# Patient Record
Sex: Female | Born: 1935 | Race: White | Hispanic: No | Smoking: Former smoker
Health system: Southern US, Community
[De-identification: ages and names within clinical notes are randomized; demographics above are authoritative.]

## PROBLEM LIST (undated history)

## (undated) DIAGNOSIS — I10 Essential (primary) hypertension: Secondary | ICD-10-CM

## (undated) DIAGNOSIS — E559 Vitamin D deficiency, unspecified: Secondary | ICD-10-CM

## (undated) DIAGNOSIS — N183 Chronic kidney disease, stage 3 (moderate): Secondary | ICD-10-CM

## (undated) DIAGNOSIS — I959 Hypotension, unspecified: Secondary | ICD-10-CM

## (undated) DIAGNOSIS — E1140 Type 2 diabetes mellitus with diabetic neuropathy, unspecified: Secondary | ICD-10-CM

## (undated) DIAGNOSIS — E119 Type 2 diabetes mellitus without complications: Secondary | ICD-10-CM

## (undated) DIAGNOSIS — Z905 Acquired absence of kidney: Secondary | ICD-10-CM

## (undated) DIAGNOSIS — I639 Cerebral infarction, unspecified: Secondary | ICD-10-CM

## (undated) DIAGNOSIS — E785 Hyperlipidemia, unspecified: Secondary | ICD-10-CM

## (undated) DIAGNOSIS — R911 Solitary pulmonary nodule: Secondary | ICD-10-CM

## (undated) DIAGNOSIS — J449 Chronic obstructive pulmonary disease, unspecified: Secondary | ICD-10-CM

## (undated) DIAGNOSIS — C4490 Unspecified malignant neoplasm of skin, unspecified: Secondary | ICD-10-CM

## (undated) HISTORY — DX: Type 2 diabetes mellitus without complications: E11.9

## (undated) HISTORY — PX: OTHER SURGICAL HISTORY: SHX169

## (undated) HISTORY — PX: MASTECTOMY: SHX3

## (undated) HISTORY — DX: Cerebral infarction, unspecified: I63.9

## (undated) HISTORY — DX: Chronic obstructive pulmonary disease, unspecified: J44.9

## (undated) HISTORY — DX: Hyperlipidemia, unspecified: E78.5

## (undated) HISTORY — DX: Hypotension, unspecified: I95.9

## (undated) HISTORY — DX: Chronic kidney disease, stage 3 (moderate): N18.3

## (undated) HISTORY — PX: INTRAOCULAR LENS INSERTION: SHX110

## (undated) HISTORY — DX: Vitamin D deficiency, unspecified: E55.9

## (undated) HISTORY — PX: CHOLECYSTECTOMY: SHX55

## (undated) HISTORY — DX: Essential (primary) hypertension: I10

## (undated) HISTORY — PX: NEPHRECTOMY: SHX65

## (undated) HISTORY — PX: ORTHOPEDIC SURGERY: SHX850

## (undated) HISTORY — PX: KNEE SURGERY: SHX244

## (undated) HISTORY — DX: Solitary pulmonary nodule: R91.1

## (undated) HISTORY — DX: Unspecified malignant neoplasm of skin, unspecified: C44.90

## (undated) HISTORY — DX: Acquired absence of kidney: Z90.5

## (undated) HISTORY — DX: Type 2 diabetes mellitus with diabetic neuropathy, unspecified: E11.40

---

## 2017-04-20 DIAGNOSIS — E785 Hyperlipidemia, unspecified: Secondary | ICD-10-CM | POA: Insufficient documentation

## 2017-04-20 DIAGNOSIS — Z905 Acquired absence of kidney: Secondary | ICD-10-CM

## 2017-04-20 DIAGNOSIS — N183 Chronic kidney disease, stage 3 unspecified: Secondary | ICD-10-CM

## 2017-04-20 DIAGNOSIS — I10 Essential (primary) hypertension: Secondary | ICD-10-CM | POA: Insufficient documentation

## 2017-04-20 DIAGNOSIS — E119 Type 2 diabetes mellitus without complications: Secondary | ICD-10-CM | POA: Insufficient documentation

## 2017-04-20 DIAGNOSIS — E559 Vitamin D deficiency, unspecified: Secondary | ICD-10-CM

## 2017-04-20 HISTORY — DX: Essential (primary) hypertension: I10

## 2017-04-20 HISTORY — DX: Acquired absence of kidney: Z90.5

## 2017-04-20 HISTORY — DX: Chronic kidney disease, stage 3 unspecified: N18.30

## 2017-04-20 HISTORY — DX: Vitamin D deficiency, unspecified: E55.9

## 2017-04-20 HISTORY — DX: Type 2 diabetes mellitus without complications: E11.9

## 2017-04-20 HISTORY — DX: Hyperlipidemia, unspecified: E78.5

## 2017-07-15 DIAGNOSIS — M4310 Spondylolisthesis, site unspecified: Secondary | ICD-10-CM | POA: Diagnosis not present

## 2017-07-15 DIAGNOSIS — M5416 Radiculopathy, lumbar region: Secondary | ICD-10-CM | POA: Diagnosis not present

## 2017-07-15 DIAGNOSIS — E875 Hyperkalemia: Secondary | ICD-10-CM | POA: Diagnosis not present

## 2017-07-15 DIAGNOSIS — N183 Chronic kidney disease, stage 3 (moderate): Secondary | ICD-10-CM | POA: Diagnosis not present

## 2017-07-16 DIAGNOSIS — N183 Chronic kidney disease, stage 3 (moderate): Secondary | ICD-10-CM | POA: Diagnosis not present

## 2017-07-16 DIAGNOSIS — E875 Hyperkalemia: Secondary | ICD-10-CM | POA: Diagnosis not present

## 2017-07-16 DIAGNOSIS — M5416 Radiculopathy, lumbar region: Secondary | ICD-10-CM | POA: Diagnosis not present

## 2017-07-16 DIAGNOSIS — M4310 Spondylolisthesis, site unspecified: Secondary | ICD-10-CM | POA: Diagnosis not present

## 2017-08-02 ENCOUNTER — Encounter: Payer: Medicare Other | Admitting: Thoracic Surgery (Cardiothoracic Vascular Surgery)

## 2017-08-02 ENCOUNTER — Encounter: Payer: Self-pay | Admitting: Thoracic Surgery (Cardiothoracic Vascular Surgery)

## 2017-08-02 ENCOUNTER — Other Ambulatory Visit: Payer: Self-pay

## 2017-08-02 DIAGNOSIS — R911 Solitary pulmonary nodule: Secondary | ICD-10-CM | POA: Insufficient documentation

## 2017-08-11 ENCOUNTER — Other Ambulatory Visit: Payer: Self-pay

## 2017-08-17 ENCOUNTER — Encounter: Payer: Self-pay | Admitting: Cardiology

## 2017-08-18 ENCOUNTER — Encounter: Payer: Self-pay | Admitting: Cardiology

## 2017-08-30 ENCOUNTER — Ambulatory Visit: Payer: Self-pay | Admitting: Cardiology

## 2017-08-31 ENCOUNTER — Encounter: Payer: Self-pay | Admitting: Cardiology

## 2017-09-13 ENCOUNTER — Institutional Professional Consult (permissible substitution): Payer: Medicare Other | Admitting: Thoracic Surgery (Cardiothoracic Vascular Surgery)

## 2017-09-13 ENCOUNTER — Encounter: Payer: Self-pay | Admitting: Thoracic Surgery (Cardiothoracic Vascular Surgery)

## 2017-09-13 ENCOUNTER — Other Ambulatory Visit: Payer: Self-pay

## 2017-09-13 VITALS — BP 142/62 | HR 78 | Resp 16 | Ht 65.0 in | Wt 170.0 lb

## 2017-09-13 DIAGNOSIS — R918 Other nonspecific abnormal finding of lung field: Secondary | ICD-10-CM | POA: Diagnosis not present

## 2017-09-13 NOTE — Progress Notes (Signed)
PCP is Imagene Riches, NP Referring Provider is Gardiner Rhyme, MD  Chief Complaint  Patient presents with  . Lung Lesion    multiple both lungs, particularly LUL/LLL.Marland KitchenMarland Kitchenper CT CHEST 08/18/16 and 06/24/17    HPI: Meredith Mckinney is sent for consultation regarding lung nodules.  Meredith Mckinney is an 84 year old former smoker with a past medical history significant for hypertension, hyperlipidemia, right nephrectomy, type 2 diabetes, diabetic neuropathy, stage III chronic kidney disease, and 2 strokes.  She is wheelchair-bound with transfers.  She smoked about a pack a day before quitting in 1995.  Back in August 2018 she had a CT of the chest to evaluate a possible lung nodule seen on a chest x-ray.  It showed multiple lung nodules bilaterally.  There was a mixed density nodule in the left upper lobe and a smaller one in the left lower lobe.  She had a follow-up CT done in June 2019.  It showed an increase in size of the left lower lobe mixed density lesion with no change in the left upper lobe or the other multiple smaller nodules bilaterally.  She has had no change in appetite or weight loss.  She denies any chest pain, pressure, or tightness.  She denies any significant cough or wheezing.   Zubrod Score: At the time of surgery this patient's most appropriate activity status/level should be described as: []     0    Normal activity, no symptoms []     1    Restricted in physical strenuous activity but ambulatory, able to do out light work []     2    Ambulatory and capable of self care, unable to do work activities, up and about >50 % of waking hours                              []     3    Only limited self care, in bed greater than 50% of waking hours [x]     4    Completely disabled, no self care, confined to bed or chair []     5    Moribund   Past Medical History:  Diagnosis Date  . CKD (chronic kidney disease) stage 3, GFR 30-59 ml/min (HCC) 04/20/2017  . COPD (chronic obstructive pulmonary  disease) ([place])   . CVA (cerebral vascular accident) ([place])   . Diabetes ([place])   . Diabetic neuropathy ([place])   . Essential hypertension 04/20/2017  . History of nephrectomy, right 04/20/2017  . Hyperlipemia   . Hyperlipidemia 04/20/2017  . Hypertension   . Hypotension   . Lung nodule   . Skin cancer   . Type 2 diabetes mellitus ([place]) 04/20/2017  . Vitamin D deficiency 04/20/2017    Past Surgical History:  Procedure Laterality Date  . arm surgery Right   . CHOLECYSTECTOMY    . INTRAOCULAR LENS INSERTION Bilateral   . KNEE SURGERY Right   . MASTECTOMY     Left with reconstruction implant in place  . MASTECTOMY Left   . NEPHRECTOMY Right   . ORTHOPEDIC SURGERY Right    Heel    Family History  Problem Relation Age of Onset  . Diabetes Mother   . Hyperlipidemia Daughter   . Hypertension Daughter     Social History Social History   Tobacco Use  . Smoking status: Former Smoker    Packs/day: 1.00    Years: 40.00    Pack years: 40.00  Types: Cigarettes    Last attempt to quit: 1995    Years since quitting: 24.7  . Smokeless tobacco: Never Used  Substance Use Topics  . Alcohol use: Yes    Comment: WINE COOLER EVERY HS  . Drug use: Not on file    Current Outpatient Medications  Medication Sig Dispense Refill  . ACCU-CHEK FASTCLIX LANCETS MISC by Does not apply route. Use as directed checking glucose 2-3 daily    . acetaminophen (TYLENOL) 500 MG tablet Take 2 tablets by mouth at bedtime.     Marland Kitchen atorvastatin (LIPITOR) 20 MG tablet Take 20 mg by mouth daily.  3  . DOXYLAMINE SUCCINATE, SLEEP, PO Take by mouth.    . gabapentin (NEURONTIN) 300 MG capsule Take 300 mg by mouth 3 (three) times daily.    Marland Kitchen glucose blood (ACCU-CHEK AVIVA) test strip 1 each by Other route as needed for other. Check blood surgars 1 time per day    . liraglutide (VICTOZA) 18 MG/3ML SOPN Inject 1.8 mLs into the skin daily.    Marland Kitchen lisinopril (PRINIVIL,ZESTRIL) 5 MG tablet Take 1 tablet by mouth daily.      No current facility-administered medications for this visit.     Allergies  Allergen Reactions  . Valium [Diazepam]     Review of Systems  Constitutional: Negative for activity change, appetite change, fever and unexpected weight change.  HENT: Positive for dental problem (Dentures). Negative for trouble swallowing and voice change.   Eyes: Negative for visual disturbance.  Respiratory: Negative for cough, chest tightness and shortness of breath.   Cardiovascular: Negative for chest pain.  Gastrointestinal: Negative for abdominal pain.  Genitourinary: Negative for difficulty urinating and dysuria.       Single kidney  Musculoskeletal: Positive for gait problem.  Neurological: Positive for weakness and numbness (Both feet). Negative for syncope.       Memory problems per daughter  Hematological: Negative for adenopathy. Bruises/bleeds easily.  All other systems reviewed and are negative.   BP (!) 142/62 (BP Location: Right Arm, Patient Position: Sitting, Cuff Size: Normal)   Pulse 78   Resp 16   Ht 5\' 5"  (1.651 m)   Wt 170 lb (77.1 kg)   SpO2 97% Comment: ON RA  BMI 28.29 kg/m  Physical Exam  Constitutional: She is oriented to person, place, and time. No distress.  Obese  HENT:  Head: Normocephalic and atraumatic.  Mouth/Throat: No oropharyngeal exudate.  Eyes: Conjunctivae and EOM are normal. No scleral icterus.  Neck: No thyromegaly present.  Cardiovascular: Normal rate, regular rhythm and normal heart sounds.  Pulmonary/Chest: Effort normal and breath sounds normal. No respiratory distress. She has no wheezes. She has no rales.  Abdominal: Soft. She exhibits no distension. There is no tenderness.  Musculoskeletal: She exhibits no edema or deformity.  Lymphadenopathy:    She has no cervical adenopathy.  Neurological: She is alert and oriented to person, place, and time. No cranial nerve deficit.  Vitals reviewed.  Diagnostic Tests: CT chest 06/27/2017  report Impression 1.  Increased size of part solid left lower lobe nodule and stable to slightly increased size of heart solid left upper lobe nodule.  Both of these are suspicious for adenocarcinoma (versus a postinfectious postinflammatory etiology), and a follow-up chest CT is recommended in 6 months. 2.  Numerous additional unchanged small nodules throughout both lungs 3.  Emphysema 4.  Aortic atherosclerosis.  Extensive coronary artery atherosclerosis Electronically signed Ethlyn Gallery, MD  I personally reviewed the CT  images  Impression: Meredith Mckinney is an 84 year old former smoker with multiple medical problems including obesity, hypertension, previous strokes, deconditioning, aortic atherosclerosis, type 2 diabetes, diabetic nephropathy, stage III chronic kidney disease among other issues.  She was found to have lung nodules on a CT that was done in August 2018.  She had a follow-up CT done in June 2019.  Majority of the nodules are unchanged.  By official report there may been a slight increase in size of the left upper lobe mixed density nodule although I do not see much difference there.  There was more significant difference in size in the lower lobe nodule.  When comparing these films side-by-side there was a great deal of motion artifact in the scan done in August 2018 compared to June 2019.  Therefore not sure there is quite as big a difference as the report makes it out to be.  In the event, she has multiple lung nodules which are worrisome for low-grade adenocarcinomas.  These were first discovered over a year ago and then the follow-up CT scan was done 3 months ago.  I am not sure exactly what the delay was in referring her, but she did miss an appointment.  At this point she would need a new scan done before we did any intervention.  I would recommend we wait and do a 24-month follow-up CT.  If there is any sign of progression at that point we can do a PET CT.  She is absolutely not  an operative candidate.  She may be a candidate for stereotactic radiation.  Plan: Return in 3 months with CT chest (6 months from prior CT)  Melrose Nakayama, MD [facility] [phone]

## 2017-11-11 ENCOUNTER — Other Ambulatory Visit: Payer: Self-pay | Admitting: *Deleted

## 2017-11-11 DIAGNOSIS — R918 Other nonspecific abnormal finding of lung field: Secondary | ICD-10-CM

## 2017-12-13 ENCOUNTER — Other Ambulatory Visit: Payer: Medicare Other

## 2017-12-13 ENCOUNTER — Ambulatory Visit: Payer: Medicare Other | Admitting: Thoracic Surgery (Cardiothoracic Vascular Surgery)

## 2017-12-20 ENCOUNTER — Ambulatory Visit
Admission: RE | Admit: 2017-12-20 | Discharge: 2017-12-20 | Disposition: A | Discharge status: Home / Self Care | Payer: Medicare Other | Source: Ambulatory Visit | Attending: Thoracic Surgery (Cardiothoracic Vascular Surgery) | Admitting: Thoracic Surgery (Cardiothoracic Vascular Surgery)

## 2017-12-20 ENCOUNTER — Ambulatory Visit: Payer: Medicare Other | Admitting: Thoracic Surgery (Cardiothoracic Vascular Surgery)

## 2017-12-20 ENCOUNTER — Other Ambulatory Visit: Payer: Self-pay | Admitting: *Deleted

## 2017-12-20 VITALS — BP 140/58 | HR 76 | Resp 20 | Ht 65.0 in

## 2017-12-20 DIAGNOSIS — R918 Other nonspecific abnormal finding of lung field: Secondary | ICD-10-CM | POA: Diagnosis not present

## 2017-12-20 NOTE — Progress Notes (Signed)
[address]       [place] [address]             [phone]    HPI: Meredith Mckinney returns to discuss the results of her CT scan  Meredith Mckinney is an 84 year old woman with a past history of tobacco abuse, hypertension, hyperlipidemia, right nephrectomy, type 2 diabetes, diabetic nephropathy, 2 strokes, and stage III chronic kidney disease.  She is wheelchair-bound but does transfer.  She quit smoking in 1995.  In August 2018 she had a CT of the chest to evaluate a possible lung nodule.  She had multiple lung nodules bilaterally.  Follow-up CT in June 2019 showed an increase in size of the left lower lobe lesion, but no change in the upper lobe lesion.  She was referred to me but had multiple delays before I saw her in September.  She says she has been feeling well with no new issues.  Past Medical History:  Diagnosis Date  . CKD (chronic kidney disease) stage 3, GFR 30-59 ml/min (HCC) 04/20/2017  . COPD (chronic obstructive pulmonary disease) ([place])   . CVA (cerebral vascular accident) ([place])   . Diabetes ([place])   . Diabetic neuropathy ([place])   . Essential hypertension 04/20/2017  . History of nephrectomy, right 04/20/2017  . Hyperlipemia   . Hyperlipidemia 04/20/2017  . Hypertension   . Hypotension   . Lung nodule   . Skin cancer   . Type 2 diabetes mellitus ([place]) 04/20/2017  . Vitamin D deficiency 04/20/2017    Current Outpatient Medications  Medication Sig Dispense Refill  . ACCU-CHEK FASTCLIX LANCETS MISC by Does not apply route. Use as directed checking glucose 2-3 daily    . acetaminophen (TYLENOL) 500 MG tablet Take 2 tablets by mouth at bedtime.     Marland Kitchen atorvastatin (LIPITOR) 20 MG tablet Take 20 mg by mouth daily.  3  . DOXYLAMINE SUCCINATE, SLEEP, PO Take by mouth.    . gabapentin (NEURONTIN) 300 MG capsule Take 300 mg by mouth 3 (three) times daily.    Marland Kitchen glucose blood (ACCU-CHEK AVIVA) test strip 1 each by Other route as needed for other. Check blood  surgars 1 time per day    . liraglutide (VICTOZA) 18 MG/3ML SOPN Inject 1.8 mLs into the skin daily.    Marland Kitchen lisinopril (PRINIVIL,ZESTRIL) 5 MG tablet Take 1 tablet by mouth daily.     No current facility-administered medications for this visit.     Physical Exam BP (!) 140/58   Pulse 76   Resp 20   Ht 5\' 5"  (1.651 m)   SpO2 97% Comment: RA  BMI 28.22 kg/m  84 year old woman in no acute distress Alert and oriented x3 No cervical supraclavicular adenopathy Cardiac regular rate and rhythm Lungs diminished breath sounds bilaterally  Diagnostic Tests: CT CHEST WITHOUT CONTRAST  TECHNIQUE: Multidetector CT imaging of the chest was performed following the standard protocol without IV contrast.  COMPARISON:  CT chest dated 06/27/2017  FINDINGS: Cardiovascular: Heart is normal in size.  No pericardial effusion.  No evidence of thoracic aortic aneurysm. Atherosclerotic calcifications of the aortic arch.  Three vessel coronary atherosclerosis.  Mediastinum/Nodes: No suspicious mediastinal or axillary lymphadenopathy.  Visualized thyroid is grossly unremarkable.  Lungs/Pleura: 1.6 x 2.8 cm subsolid nodule with 6 mm solid component in the posterior left upper lobe (series 8/image 52), previously 1.5 x 1.8 cm with 6 mm solid component.  1.4 x 2.3 cm subsolid nodule with 10  x 7 mm inferolateral solid component in the lateral left lower lobe (series 8/images 100 and 104), previously 1.3 x 2.0 cm with 8 x 5 mm solid component.  Additional scattered subcentimeter nodules bilaterally.  Biapical pleural-parenchymal scarring. Moderate centrilobular and paraseptal emphysematous changes, upper lobe predominant. No focal consolidation. No pleural effusion or pneumothorax.  Upper Abdomen: Visualized upper abdomen is notable for prior left nephrectomy and vascular calcifications.  Musculoskeletal: Status post left mastectomy with reconstruction. Degenerative changes of  the visualized thoracolumbar spine.  IMPRESSION: 1.4 x 2.3 cm sub solid left lower lobe pulmonary nodule with 10 x 7 mm solid component, progressive. 1.6 x 2.8 cm subsolid nodule with 6 mm solid component in the posterior left upper lobe, progressive. These findings are suspicious for multifocal invasive adenocarcinoma. Discussion at multidisciplinary tumor board is suggested.  Aortic Atherosclerosis (ICD10-I70.0) and Emphysema (ICD10-J43.9).   Electronically Signed   By: Julian Hy M.D.   On: 12/20/2017 12:50 I personally reviewed the CT images and concur with the findings noted above  Impression: Meredith Mckinney is an 84 year old woman with multiple medical problems.  She has a remote history of tobacco abuse but quit in 1995.  She has had 2 strokes and is basically wheelchair-bound.  She was incidentally found to have multiple lung nodules a little over a year ago.  Those nodules have slowly increased in size.  They are mixed density.  Findings are highly suspicious for multifocal adenocarcinomas.  Atypical infection could also give a similar appearance.  She is not a candidate for surgical resection.  She may be a candidate for stereotactic radiation.  I recommended that we do a PET CT to assess for metabolic activity and also rule out any evidence of regional or distant metastases.  I am going to refer her to radiation oncology for consideration for stereotactic radiation.  Depending on what the PET shows, they may be willing to do that without a biopsy.  If they have to have a biopsy, I could do a navigational bronchoscopy.  I do not think she would be a candidate for CT-guided biopsy due to the cavitary nature of the lesions.  Plan: PET/CT Referral to radiation oncology  Melrose Nakayama, MD [facility] [phone]

## 2018-01-11 ENCOUNTER — Encounter (HOSPITAL_COMMUNITY): Payer: Medicare Other

## 2018-01-16 ENCOUNTER — Ambulatory Visit: Payer: Medicare Other

## 2018-01-16 ENCOUNTER — Ambulatory Visit
Admission: RE | Admit: 2018-01-16 | Discharge: 2018-01-16 | Disposition: A | Discharge status: Home / Self Care | Payer: Medicare Other | Source: Ambulatory Visit | Attending: Radiation Oncology | Admitting: Radiation Oncology

## 2018-01-19 ENCOUNTER — Encounter (HOSPITAL_COMMUNITY): Payer: Self-pay | Admitting: Radiology

## 2018-01-19 ENCOUNTER — Ambulatory Visit (HOSPITAL_COMMUNITY)
Admission: RE | Admit: 2018-01-19 | Discharge: 2018-01-19 | Disposition: A | Discharge status: Home / Self Care | Payer: Medicare Other | Source: Ambulatory Visit | Attending: Thoracic Surgery (Cardiothoracic Vascular Surgery) | Admitting: Thoracic Surgery (Cardiothoracic Vascular Surgery)

## 2018-01-19 DIAGNOSIS — R918 Other nonspecific abnormal finding of lung field: Secondary | ICD-10-CM | POA: Diagnosis present

## 2018-01-19 LAB — GLUCOSE, CAPILLARY: GLUCOSE-CAPILLARY: 177 mg/dL — AB (ref 70–99)

## 2018-01-19 MED ORDER — FLUDEOXYGLUCOSE F - 18 (FDG) INJECTION
8.4900 | Freq: Once | INTRAVENOUS | Status: AC | PRN
  Administered 2018-01-19: 8.49 via INTRAVENOUS

## 2018-02-01 NOTE — Progress Notes (Signed)
Thoracic Location of Tumor / Histology: Per CT Chest 12/20/17:  IMPRESSION: 1.4 x 2.3 cm sub solid left lower lobe pulmonary nodule with 10 x 7 mm solid component, progressive. 1.6 x 2.8 cm subsolid nodule with 6 mm solid component in the posterior left upper lobe, progressive. These findings are suspicious for multifocal invasive adenocarcinoma. Discussion at multidisciplinary tumor board is Suggested.  Per PET 01/19/18:  IMPRESSION: 1. 2.1 cm left lower lobe pulmonary nodule demonstrates SUV max = 2.2 with activity mainly localized to the solid nodular component. Imaging features are suspicious for neoplasm. 2. Sub solid 2.8 cm posterior left upper lobe nodule has very low FDG accumulation. Given CT imaging features and detectable FDG uptake, well differentiated/low-grade neoplasm remains a concern. 3.  Emphysema. ([id number])  Patient presented with symptoms of: In August 2018 she had a CT of the chest to evaluate a possible lung nodule.  She had multiple lung nodules bilaterally.  Follow-up CT in June 2019 showed an increase in size of the left lower lobe lesion, but no change in the upper lobe lesion.  She was referred to me but had multiple delays before I saw her in September.   Tobacco/Marijuana/Snuff/ETOH use: She quit smoking in 1995.  Past/Anticipated interventions by cardiothoracic surgery, if any: Per Dr. Roxan Hockey 12/20/17:  Impression: Mrs. Ritthaler is an 85 year old woman with multiple medical problems.  She has a remote history of tobacco abuse but quit in 1995.  She has had 2 strokes and is basically wheelchair-bound.  She was incidentally found to have multiple lung nodules a little over a year ago.  Those nodules have slowly increased in size.  They are mixed density.  Findings are highly suspicious for multifocal adenocarcinomas.  Atypical infection could also give a similar appearance.  She is not a candidate for surgical resection.  She may be a candidate for  stereotactic radiation.  I recommended that we do a PET CT to assess for metabolic activity and also rule out any evidence of regional or distant metastases.  I am going to refer her to radiation oncology for consideration for stereotactic radiation.  Depending on what the PET shows, they may be willing to do that without a biopsy.  If they have to have a biopsy, I could do a navigational bronchoscopy.  I do not think she would be a candidate for CT-guided biopsy due to the cavitary nature of the lesions.  Past/Anticipated interventions by medical oncology, if any: None at this time.  Signs/Symptoms  Weight changes, if any: No  Respiratory complaints, if any: Pt denies any respiratory complaints  Hemoptysis, if any: No  Pain issues, if any:  No  SAFETY ISSUES:  Prior radiation? No  Pacemaker/ICD? No  Possible current pregnancy? No  Is the patient on methotrexate? No  Current Complaints / other details:  Pt presents today for initial consult with Dr. Sondra Come with Radiation Oncology. Pt is accompanied by daughter. Pt and daughter are confused as to why they are here and state that they were told they were here to review PET scan. Pt and daughter thought they were meeting with Dr. Roxan Hockey today. Pt and daughter would like for pt to receive radiation, if indicated, in [place] if possible.    She has a remote history of tobacco abuse but quit in 1995.  She has had 2 strokes and is basically wheelchair-bound.  She was incidentally found to have multiple lung nodules a little over a year ago.  Those nodules have slowly increased  in size.  They are mixed density.  Findings are highly suspicious for multifocal adenocarcinomas.  Atypical infection could also give a similar appearance.  She is not a candidate for surgical resection.  She may be a candidate for stereotactic radiation.  I recommended that we do a PET CT to assess for metabolic activity and also rule out any evidence of  regional or distant metastases.  BP (!) 179/56 (BP Location: Left Arm, Patient Position: Sitting)   Pulse 69   Temp 98.8 F (37.1 C) (Oral)   Resp 18   Ht 5\' 5"  (1.651 m)   Wt 186 lb 8 oz (84.6 kg)   SpO2 99%   BMI 31.04 kg/m   Wt Readings from Last 3 Encounters:  02/02/18 186 lb 8 oz (84.6 kg)  09/13/17 170 lb (77.1 kg)   Loma Sousa, RN BSN

## 2018-02-02 ENCOUNTER — Ambulatory Visit
Admission: RE | Admit: 2018-02-02 | Discharge: 2018-02-02 | Disposition: A | Discharge status: Home / Self Care | Payer: Medicare Other | Source: Ambulatory Visit | Attending: Radiation Oncology | Admitting: Radiation Oncology

## 2018-02-02 ENCOUNTER — Other Ambulatory Visit: Payer: Self-pay

## 2018-02-02 ENCOUNTER — Encounter: Payer: Self-pay | Admitting: Radiation Oncology

## 2018-02-02 DIAGNOSIS — Z79899 Other long term (current) drug therapy: Secondary | ICD-10-CM | POA: Diagnosis not present

## 2018-02-02 DIAGNOSIS — I129 Hypertensive chronic kidney disease with stage 1 through stage 4 chronic kidney disease, or unspecified chronic kidney disease: Secondary | ICD-10-CM | POA: Insufficient documentation

## 2018-02-02 DIAGNOSIS — D259 Leiomyoma of uterus, unspecified: Secondary | ICD-10-CM | POA: Diagnosis not present

## 2018-02-02 DIAGNOSIS — R911 Solitary pulmonary nodule: Secondary | ICD-10-CM | POA: Insufficient documentation

## 2018-02-02 DIAGNOSIS — N183 Chronic kidney disease, stage 3 (moderate): Secondary | ICD-10-CM | POA: Insufficient documentation

## 2018-02-02 DIAGNOSIS — Z9013 Acquired absence of bilateral breasts and nipples: Secondary | ICD-10-CM | POA: Insufficient documentation

## 2018-02-02 DIAGNOSIS — E1122 Type 2 diabetes mellitus with diabetic chronic kidney disease: Secondary | ICD-10-CM | POA: Insufficient documentation

## 2018-02-02 DIAGNOSIS — Z8673 Personal history of transient ischemic attack (TIA), and cerebral infarction without residual deficits: Secondary | ICD-10-CM | POA: Diagnosis not present

## 2018-02-02 DIAGNOSIS — E785 Hyperlipidemia, unspecified: Secondary | ICD-10-CM | POA: Diagnosis not present

## 2018-02-02 DIAGNOSIS — Z87891 Personal history of nicotine dependence: Secondary | ICD-10-CM | POA: Diagnosis not present

## 2018-02-02 DIAGNOSIS — E559 Vitamin D deficiency, unspecified: Secondary | ICD-10-CM | POA: Diagnosis not present

## 2018-02-02 DIAGNOSIS — C3432 Malignant neoplasm of lower lobe, left bronchus or lung: Secondary | ICD-10-CM

## 2018-02-02 NOTE — Progress Notes (Signed)
Radiation Oncology         [phone] ________________________________  Initial Outpatient Consultation  Name: Meredith Mckinney MRN: [id number]  Date: 02/02/2018  DOB: [birth year]  UX:YBFX, Ronn Melena, NP  Melrose Nakayama, *   REFERRING PHYSICIAN: Melrose Nakayama, *  DIAGNOSIS: PET positive pulmonary nodules in the left lung (2)  HISTORY OF PRESENT ILLNESS::Meredith Mckinney is a 85 y.o. female who is here at the request of Dr. Roxan Hockey for pulmonary nodules. Back in August 2018, she had a CT of the chest to evaluate a possible lung nodule seen on a chest x-ray. This scan identified multiple bilateral pulmonary nodules measuring 5 mm. There was a focus of ground-glass opacity and cystic change in the left upper lobe measuring 1.5 cm. There was also an ill-defined nodule in the left lower lobe measuring 5 mm.  She had a follow-up CT done on 06/27/2017 which showed increased size of the part solid left lower lobe nodule, measuring 2.0 x 1.3 cm, and stable to slightly increased size of the part solid left upper lobe nodule, measuring 1.8 x 1.5 cm. There were also numerous additional unchanged small nodules throughout both lungs.  She saw Dr. Roxan Hockey in September 2019 who ordered a follow-up chest CT scan that was done on 12/20/2017 and showed a 1.4 x 2.3 cm sub solid left lower lobe pulmonary nodule with 10 x 7 mm solid component, progressive. There is also a 1.6 x 2.8 cm subsolid nodule with 6 mm solid component in the posterior left upper lobe, also progressive. These findings are suspicious for multifocal invasive adenocarcinoma.   The patient reviewed these imaging results with Dr. Roxan Hockey and has kindly been referred today for discussion of potential radiation treatment options. She is not felt to be a good candidate for surgical resection.  Her PET scan taken on 01/19/2018 showed the left lower lobe pulmonary nodule measuring 2.1 cm with SUV max of 2.2, mainly localized to the  solid nodular component. The left upper lobe nodule measures 2.8 cm and has very low FDG accumulation.   She has a remote history of tobacco abuse but quit about 30 years ago. She has suffered two strokes and is wheelchair-bound. She does have a left breast implant and states that she has this because part of her breast was removed secondary to an infection. She states that this surgery was done in [place] "a long time ago".  PREVIOUS RADIATION THERAPY: No  PAST MEDICAL HISTORY:  has a past medical history of CKD (chronic kidney disease) stage 3, GFR 30-59 ml/min (HCC) (04/20/2017), COPD (chronic obstructive pulmonary disease) (HCC), CVA (cerebral vascular accident) ([place]), Diabetes ([place]), Diabetic neuropathy ([place]), Essential hypertension (04/20/2017), History of nephrectomy, right (04/20/2017), Hyperlipemia, Hyperlipidemia (04/20/2017), Hypertension, Hypotension, Lung nodule, Skin cancer, Type 2 diabetes mellitus ([place]) (04/20/2017), and Vitamin D deficiency (04/20/2017).    PAST SURGICAL HISTORY: Past Surgical History:  Procedure Laterality Date  . arm surgery Right   . CHOLECYSTECTOMY    . INTRAOCULAR LENS INSERTION Bilateral   . KNEE SURGERY Right   . MASTECTOMY     Left with reconstruction implant in place  . MASTECTOMY Left   . NEPHRECTOMY Right   . ORTHOPEDIC SURGERY Right    Heel    FAMILY HISTORY: family history includes Diabetes in her mother; Hyperlipidemia in her daughter; Hypertension in her daughter.  SOCIAL HISTORY:  reports that she quit smoking about 25 years ago. Her smoking use included cigarettes. She has a 40.00 pack-year smoking history.  She has never used smokeless tobacco. She reports current alcohol use. Resides with her daughter in [place]. Originally from [place].  ALLERGIES: Valium [diazepam]  MEDICATIONS:  Current Outpatient Medications  Medication Sig Dispense Refill  . ACCU-CHEK FASTCLIX LANCETS MISC by Does not apply route. Use as directed checking glucose 2-3  daily    . acetaminophen (TYLENOL) 500 MG tablet Take 2 tablets by mouth at bedtime.     Marland Kitchen atorvastatin (LIPITOR) 20 MG tablet Take 20 mg by mouth daily.  3  . DOXYLAMINE SUCCINATE, SLEEP, PO Take by mouth.    . gabapentin (NEURONTIN) 300 MG capsule Take 300 mg by mouth 3 (three) times daily.    Marland Kitchen glucose blood (ACCU-CHEK AVIVA) test strip 1 each by Other route as needed for other. Check blood surgars 1 time per day    . liraglutide (VICTOZA) 18 MG/3ML SOPN Inject 1.8 mLs into the skin daily.    Marland Kitchen lisinopril (PRINIVIL,ZESTRIL) 5 MG tablet Take 1 tablet by mouth daily.     No current facility-administered medications for this encounter.     REVIEW OF SYSTEMS:  REVIEW OF SYSTEMS: A 10+ POINT REVIEW OF SYSTEMS WAS OBTAINED including neurology, dermatology, psychiatry, cardiac, respiratory, lymph, extremities, GI, GU, musculoskeletal, constitutional, reproductive, HEENT. All pertinent positives are noted in the HPI. All others are negative.   PHYSICAL EXAM:  height is _0  (1.651 m) and weight is 186 lb 8 oz (84.6 kg). Her oral temperature is 98.8 F (37.1 C). Her blood pressure is 179/56 (abnormal) and her pulse is 69. Her respiration is 18 and oxygen saturation is 99%.   General: Alert and oriented, in no acute distress, sitting in wheelchair. HEENT: Head is normocephalic. Extraocular movements are intact. Oropharynx is clear. Dentures on the top. Neck: Neck is supple, no palpable cervical or supraclavicular lymphadenopathy. Heart: Regular in rate and rhythm with no murmurs, rubs, or gallops. Chest: Clear to auscultation bilaterally, with no rhonchi, wheezes, or rales. Abdomen: Soft, nontender, nondistended, with no rigidity or guarding. Extremities: No cyanosis or edema. Lymphatics: see Neck Exam Skin: No concerning lesions. Musculoskeletal: Symmetric strength and muscle tone throughout. Neurologic: Cranial nerves II through XII are grossly intact. No obvious focalities. Speech is fluent.  Coordination is intact. Psychiatric: Judgment and insight are intact. Affect is appropriate.   ECOG = 2  0 - Asymptomatic (Fully active, able to carry on all predisease activities without restriction)  1 - Symptomatic but completely ambulatory (Restricted in physically strenuous activity but ambulatory and able to carry out work of a light or sedentary nature. For example, light housework, office work)  2 - Symptomatic, <50% in bed during the day (Ambulatory and capable of all self care but unable to carry out any work activities. Up and about more than 50% of waking hours)  3 - Symptomatic, >50% in bed, but not bedbound (Capable of only limited self-care, confined to bed or chair 50% or more of waking hours)  4 - Bedbound (Completely disabled. Cannot carry on any self-care. Totally confined to bed or chair)  5 - Death   Eustace Pen MM, Creech RH, Tormey DC, et al. [phone]). "Toxicity and response criteria of the [facility]". [place] Oncol. 5 (6): 649-55  LABORATORY DATA:  No results found for: WBC, HGB, HCT, MCV, PLT, NEUTROABS No results found for: NA, K, CL, CO2, GLUCOSE, CREATININE, CALCIUM    RADIOGRAPHY: Nm Pet Image Initial (pi) Skull Base To Thigh  Result Date: 01/19/2018 CLINICAL DATA:  Initial treatment strategy for lung nodules. EXAM: NUCLEAR MEDICINE PET SKULL BASE TO THIGH TECHNIQUE: 8.5 mCi F-18 FDG was injected intravenously. Full-ring PET imaging was performed from the skull base to thigh after the radiotracer. CT data was obtained and used for attenuation correction and anatomic localization. Fasting blood glucose: 177 mg/dl COMPARISON:  CT chest 12/20/2017 FINDINGS: Mediastinal blood pool activity: SUV max 2.4 NECK: No hypermetabolic lymph nodes in the neck. Incidental CT findings: none CHEST: The left lower lobe pulmonary nodule measured previously at 1.4 x 2.3 cm is similar today at 1.5 x 2.1 cm (46/8). This lesion shows low level hypermetabolism  with SUV max = 2.2. The hypermetabolism appears to be localized to the more solid, nodular component along the inferolateral margin of the lesion. The 2.8 cm posterior left upper lobe sub solid nodule (image 23/8) shows very low level FDG uptake with SUV max = 0.8. Additional tiny bilateral pulmonary nodules show no discernible hypermetabolism but this may be due to their tiny size. Incidental CT findings: Emphysema noted bilaterally. No pleural effusion. Coronary artery calcification is evident. Atherosclerotic calcification is noted in the wall of the thoracic aorta. ABDOMEN/PELVIS: No abnormal hypermetabolic activity within the liver, pancreas, adrenal glands, or spleen. No hypermetabolic lymph nodes in the abdomen or pelvis. Incidental CT findings: Left kidney surgically absent. Tiny stones identified lower pole right kidney. Diverticular changes noted in the left colon without diverticulitis. Fibroid change noted in the uterus. SKELETON: No focal hypermetabolic activity to suggest skeletal metastasis. Incidental CT findings: none IMPRESSION: 1. 2.1 cm left lower lobe pulmonary nodule demonstrates SUV max = 2.2 with activity mainly localized to the solid nodular component. Imaging features are suspicious for neoplasm. 2. Sub solid 2.8 cm posterior left upper lobe nodule has very low FDG accumulation. Given CT imaging features and detectable FDG uptake, well differentiated/low-grade neoplasm remains a concern. 3.  Emphysema. (ICD10-J43.9) 4.  Aortic Atherosclerois (ICD10-170.0) Electronically Signed   By: Misty Stanley M.D.   On: 01/19/2018 13:28      IMPRESSION: PET positive pulmonary nodules in the left lung  We reviewed the patient's recent PET scan and showed the images to the patient and her daughter. We discussed that she likely has two early-stage non-small cell lung cancers. She has met with Dr. Roxan Hockey and is not felt to be a candidate for surgery of these lesions in light of her medical issues.  We discussed options for management including close follow-up, navigational bronchoscopy and biopsy followed by SBRT, or SBRT without biopsy or tissue diagnosis. Dr. Roxan Hockey, in light of the patient's medical history and lesions, does not feel the patient is a good candidate for CT-guided biopsy.  After careful consideration, the patient and her daughter wish that she proceed with SBRT of both lesions without tissue diagnosis.  Today, I talked to the patient and her daughter about the findings and work-up thus far.  We discussed the natural history of non-small cell lung cancer and general treatment, highlighting the role of stereotactic body radiotherapy in the management.  We discussed the available radiation techniques, and focused on the details of logistics and delivery.  We reviewed the anticipated acute and late sequelae associated with radiation in this setting.  The patient was encouraged to ask questions that I answered to the best of my ability.  A patient consent form was discussed and signed.  We retained a copy for our records.  The patient would like to proceed with radiation and will be scheduled for CT  simulation.  PLAN: Patient will proceed with SBRT CT simulation on Monday February 3rd at 10:00 AM with treatment to begin approximately 1 week later. Anticipate 3 fractions to each lesion.     ------------------------------------------------  Blair Promise, PhD, MD  This document serves as a record of services personally performed by Gery Pray, MD. It was created on his behalf by Rae Lips, a trained medical scribe. The creation of this record is based on the scribe's personal observations and the provider's statements to them. This document has been checked and approved by the attending provider.

## 2018-02-06 ENCOUNTER — Ambulatory Visit: Payer: Medicare Other | Admitting: Radiation Oncology

## 2018-02-13 ENCOUNTER — Ambulatory Visit: Payer: Medicare Other | Admitting: Radiation Oncology

## 2018-02-23 ENCOUNTER — Ambulatory Visit: Admission: RE | Admit: 2018-02-23 | Payer: Medicare Other | Source: Ambulatory Visit | Admitting: Radiation Oncology

## 2018-02-28 ENCOUNTER — Ambulatory Visit
Admission: RE | Admit: 2018-02-28 | Discharge: 2018-02-28 | Disposition: A | Discharge status: Home / Self Care | Payer: Medicare Other | Source: Ambulatory Visit | Attending: Radiation Oncology | Admitting: Radiation Oncology

## 2018-02-28 ENCOUNTER — Ambulatory Visit: Payer: Medicare Other | Admitting: Radiation Oncology

## 2018-02-28 DIAGNOSIS — Z510 Encounter for antineoplastic radiation therapy: Secondary | ICD-10-CM | POA: Diagnosis not present

## 2018-02-28 DIAGNOSIS — R911 Solitary pulmonary nodule: Secondary | ICD-10-CM

## 2018-03-02 ENCOUNTER — Ambulatory Visit: Payer: Medicare Other | Admitting: Radiation Oncology

## 2018-03-05 NOTE — Progress Notes (Signed)
  Radiation Oncology         [phone] ________________________________  Name: Meredith Mckinney MRN: [id number]  Date: 02/28/2018  DOB: 1933/04/16   STEREOTACTIC BODY RADIOTHERAPY SIMULATION AND TREATMENT PLANNING NOTE   DIAGNOSIS:  PET positive pulmonary nodules in the left lung (2)  NARRATIVE:  The patient was brought to the [facility].  Identity was confirmed.  All relevant records and images related to the planned course of therapy were reviewed.  The patient freely provided informed written consent to proceed with treatment after reviewing the details related to the planned course of therapy. The consent form was witnessed and verified by the simulation staff.  Then, the patient was set-up in a stable reproducible  supine position for radiation therapy.  A BodyFix immobilization pillow was fabricated for reproducible positioning.  Then I personally applied the abdominal compression paddle to limit respiratory excursion.  4D respiratoy motion management CT images were obtained.  Surface markings were placed.  The CT images were loaded into the planning software.  Then, using Cine, MIP, and standard views, the internal target volume (ITV) and planning target volumes (PTV) were delinieated, and avoidance structures were contoured.  Treatment planning then occurred.  The radiation prescription was entered and confirmed.  A total of two complex treatment devices were fabricated in the form of the BodyFix immobilization pillow and a neck accuform cushion.  I have requested : 3D Simulation  I have requested a DVH of the following structures: Heart, Lungs, Esophagus, Chest Wall, Brachial Plexus, Major Blood Vessels, and targets.  PLAN:  The patient will receive 54 Gy in 3 fractions Directed at the 2 PET positive pulmonary nodules within the left lung.  -----------------------------------  Blair Promise, PhD, MD

## 2018-03-06 ENCOUNTER — Ambulatory Visit: Payer: Medicare Other | Admitting: Radiation Oncology

## 2018-03-07 ENCOUNTER — Ambulatory Visit: Payer: Medicare Other | Admitting: Radiation Oncology

## 2018-03-07 DIAGNOSIS — Z510 Encounter for antineoplastic radiation therapy: Secondary | ICD-10-CM | POA: Diagnosis not present

## 2018-03-07 DIAGNOSIS — R911 Solitary pulmonary nodule: Secondary | ICD-10-CM | POA: Insufficient documentation

## 2018-03-08 ENCOUNTER — Ambulatory Visit: Payer: Medicare Other | Admitting: Radiation Oncology

## 2018-03-10 ENCOUNTER — Ambulatory Visit: Payer: Medicare Other | Admitting: Radiation Oncology

## 2018-03-14 ENCOUNTER — Ambulatory Visit: Payer: Medicare Other | Admitting: Radiation Oncology

## 2018-03-16 ENCOUNTER — Other Ambulatory Visit: Payer: Self-pay

## 2018-03-16 ENCOUNTER — Ambulatory Visit
Admission: RE | Admit: 2018-03-16 | Discharge: 2018-03-16 | Disposition: A | Discharge status: Home / Self Care | Payer: Medicare Other | Source: Ambulatory Visit | Attending: Radiation Oncology | Admitting: Radiation Oncology

## 2018-03-16 DIAGNOSIS — Z510 Encounter for antineoplastic radiation therapy: Secondary | ICD-10-CM | POA: Diagnosis not present

## 2018-03-16 DIAGNOSIS — R911 Solitary pulmonary nodule: Secondary | ICD-10-CM

## 2018-03-16 NOTE — Progress Notes (Signed)
  Radiation Oncology         [phone] ________________________________  Name: Mathilda Maguire MRN: [id number]  Date: 03/16/2018  DOB: [birth year]  Stereotactic Body Radiotherapy Treatment Procedure Note  NARRATIVE:  Laconda Basich was brought to the stereotactic radiation treatment machine and placed supine on the CT couch. The patient was set up for stereotactic body radiotherapy on the body fix pillow.  3D TREATMENT PLANNING AND DOSIMETRY:  The patient's radiation plan was reviewed and approved prior to starting treatment.  It showed 3-dimensional radiation distributions overlaid onto the planning CT.  The [facility] for the target structures as well as the organs at risk were reviewed. The documentation of this is filed in the radiation oncology EMR.  SIMULATION VERIFICATION:  The patient underwent CT imaging on the treatment unit.  These were carefully aligned to document that the ablative radiation dose would cover the target volume and maximally spare the nearby organs at risk according to the planned distribution.  SPECIAL TREATMENT PROCEDURE: Christene Pounds received high dose ablative stereotactic body radiotherapy to the planned target volume without unforeseen complications. Treatment was delivered uneventfully. The high doses associated with stereotactic body radiotherapy and the significant potential risks require careful treatment set up and patient monitoring constituting a special treatment procedure   STEREOTACTIC TREATMENT MANAGEMENT:  Following delivery, the patient was evaluated clinically. The patient tolerated treatment without significant acute effects, and was discharged to home in stable condition.    PLAN: Continue treatment as planned.  ________________________________  Blair Promise, PhD, MD  This document serves as a record of services personally performed by Gery Pray, MD. It was created on his behalf by Rae Lips, a trained medical scribe. The creation of this  record is based on the scribe's personal observations and the provider's statements to them. This document has been checked and approved by the attending provider.

## 2018-03-21 ENCOUNTER — Ambulatory Visit: Payer: Medicare Other | Admitting: Radiation Oncology

## 2018-03-23 ENCOUNTER — Ambulatory Visit: Payer: Medicare Other | Admitting: Radiation Oncology

## 2018-03-27 ENCOUNTER — Ambulatory Visit: Payer: Medicare Other | Admitting: Radiation Oncology

## 2018-03-28 ENCOUNTER — Ambulatory Visit
Admission: RE | Admit: 2018-03-28 | Discharge: 2018-03-28 | Disposition: A | Discharge status: Home / Self Care | Payer: Medicare Other | Source: Ambulatory Visit | Attending: Radiation Oncology | Admitting: Radiation Oncology

## 2018-03-28 ENCOUNTER — Ambulatory Visit: Payer: Medicare Other | Admitting: Radiation Oncology

## 2018-03-28 ENCOUNTER — Other Ambulatory Visit: Payer: Self-pay

## 2018-03-28 DIAGNOSIS — Z510 Encounter for antineoplastic radiation therapy: Secondary | ICD-10-CM | POA: Diagnosis not present

## 2018-03-28 DIAGNOSIS — R911 Solitary pulmonary nodule: Secondary | ICD-10-CM

## 2018-03-28 NOTE — Progress Notes (Signed)
  Radiation Oncology         [phone] ________________________________  Name: Meredith Mckinney MRN: [id number]  Date: 03/28/2018  DOB: May 16, 1933  Stereotactic Body Radiotherapy Treatment Procedure Note  NARRATIVE:  Meredith Mckinney was brought to the stereotactic radiation treatment machine and placed supine on the CT couch. The patient was set up for stereotactic body radiotherapy on the body fix pillow.  3D TREATMENT PLANNING AND DOSIMETRY:  The patient's radiation plan was reviewed and approved prior to starting treatment.  It showed 3-dimensional radiation distributions overlaid onto the planning CT.  The [facility] for the target structures as well as the organs at risk were reviewed. The documentation of this is filed in the radiation oncology EMR.  SIMULATION VERIFICATION:  The patient underwent CT imaging on the treatment unit.  These were carefully aligned to document that the ablative radiation dose would cover the target volume and maximally spare the nearby organs at risk according to the planned distribution.  SPECIAL TREATMENT PROCEDURE: Meredith Mckinney received high dose ablative stereotactic body radiotherapy to the planned target volume without unforeseen complications. Treatment was delivered uneventfully. The high doses associated with stereotactic body radiotherapy and the significant potential risks require careful treatment set up and patient monitoring constituting a special treatment procedure   STEREOTACTIC TREATMENT MANAGEMENT:  Following delivery, the patient was evaluated clinically. The patient tolerated treatment without significant acute effects, and was discharged to home in stable condition.    PLAN: Continue treatment as planned.  ________________________________  Blair Promise, PhD, MD  This document serves as a record of services personally performed by Gery Pray, MD. It was created on his behalf by Rae Lips, a trained medical scribe. The creation of this  record is based on the scribe's personal observations and the provider's statements to them. This document has been checked and approved by the attending provider.

## 2018-03-29 ENCOUNTER — Ambulatory Visit: Payer: Medicare Other | Admitting: Radiation Oncology

## 2018-03-30 ENCOUNTER — Ambulatory Visit: Payer: Medicare Other | Admitting: Radiation Oncology

## 2018-03-31 ENCOUNTER — Ambulatory Visit
Admission: RE | Admit: 2018-03-31 | Discharge: 2018-03-31 | Disposition: A | Discharge status: Home / Self Care | Payer: Medicare Other | Source: Ambulatory Visit | Attending: Radiation Oncology | Admitting: Radiation Oncology

## 2018-03-31 ENCOUNTER — Other Ambulatory Visit: Payer: Self-pay

## 2018-03-31 DIAGNOSIS — Z510 Encounter for antineoplastic radiation therapy: Secondary | ICD-10-CM | POA: Diagnosis not present

## 2018-03-31 DIAGNOSIS — R911 Solitary pulmonary nodule: Secondary | ICD-10-CM

## 2018-04-02 NOTE — Progress Notes (Signed)
  Radiation Oncology         [phone] ________________________________  Name: Meredith Mckinney MRN: [id number]  Date: 03/31/2018  DOB: [birth year]  Stereotactic Body Radiotherapy Treatment Procedure Note  NARRATIVE:  Meredith Mckinney was brought to the stereotactic radiation treatment machine and placed supine on the CT couch. The patient was set up for stereotactic body radiotherapy on the body fix pillow.  3D TREATMENT PLANNING AND DOSIMETRY:  The patient's radiation plan was reviewed and approved prior to starting treatment.  It showed 3-dimensional radiation distributions overlaid onto the planning CT.  The [facility] for the target structures as well as the organs at risk were reviewed. The documentation of this is filed in the radiation oncology EMR.  SIMULATION VERIFICATION:  The patient underwent CT imaging on the treatment unit.  These were carefully aligned to document that the ablative radiation dose would cover the target volume and maximally spare the nearby organs at risk according to the planned distribution.  SPECIAL TREATMENT PROCEDURE: Meredith Mckinney received high dose ablative stereotactic body radiotherapy to the planned target volume without unforeseen complications. Treatment was delivered uneventfully. The high doses associated with stereotactic body radiotherapy and the significant potential risks require careful treatment set up and patient monitoring constituting a special treatment procedure   STEREOTACTIC TREATMENT MANAGEMENT:  Following delivery, the patient was evaluated clinically. The patient tolerated treatment without significant acute effects, and was discharged to home in stable condition.    PLAN: Continue treatment as planned.  ________________________________  Blair Promise, PhD, MD

## 2018-04-03 ENCOUNTER — Encounter: Payer: Self-pay | Admitting: Radiation Oncology

## 2018-04-03 NOTE — Progress Notes (Signed)
  Radiation Oncology         [phone]) [phone] ________________________________  Name: Meredith Mckinney MRN: [id number]  Date: 04/03/2018  DOB: 1933/10/26  End of Treatment Note  Diagnosis:   PET positive pulmonary nodules in the left lung(2)     Indication for treatment:  Curative       Radiation treatment dates:   03/16/18, 03/28/18, 03/31/18   Site/dose:   Left lung, 18 Gy in 3 fractions for a total of 54 Gy  Beams/energy:   Photon SBRT; 6X-FFF  Narrative: The patient tolerated radiation treatment relatively well.     During her short course of treatment, she denied any pain, fatigue, SOB, difficulty with swallowing, headaches, CP, or numbness/tingling in her extremities and skin issues. Towards the end of treatment, pt's blood pressure was slightly elevated. Overall the pt was without complaints.   Plan: The patient has completed radiation treatment. The patient will return to radiation oncology clinic for routine followup in one month. I advised them to call or return sooner if they have any questions or concerns related to their recovery or treatment.  -----------------------------------  Blair Promise, PhD, MD  This document serves as a record of services personally performed by Gery Pray, MD. It was created on his behalf by Mary-Margaret Loma Messing, a trained medical scribe. The creation of this record is based on the scribe's personal observations and the provider's statements to them. This document has been checked and approved by the attending provider.

## 2018-04-27 ENCOUNTER — Telehealth: Payer: Self-pay | Admitting: *Deleted

## 2018-04-27 ENCOUNTER — Telehealth: Payer: Self-pay

## 2018-04-27 NOTE — Telephone Encounter (Signed)
CALLED PATIENT TO ALTER FU APPT. FOR 05-01-18 DUE TO COVID 19, RESCHEDULED FOR 08-03-18 @ 11:30 AM , PATIENT'S DAUGHTER AGREED TO NEW TIME AND DATE

## 2018-04-27 NOTE — Telephone Encounter (Signed)
Attempted contact with pt or daughter to discuss upcoming appt and Covid 19 concerns. Voice mail box was full. This RN was unable to leave message. Loma Sousa, RN BSN

## 2018-05-01 ENCOUNTER — Ambulatory Visit: Payer: Medicare Other | Admitting: Radiation Oncology

## 2018-07-23 DIAGNOSIS — E860 Dehydration: Secondary | ICD-10-CM

## 2018-07-23 DIAGNOSIS — K5900 Constipation, unspecified: Secondary | ICD-10-CM

## 2018-07-23 DIAGNOSIS — I10 Essential (primary) hypertension: Secondary | ICD-10-CM

## 2018-07-23 DIAGNOSIS — R531 Weakness: Secondary | ICD-10-CM

## 2018-07-23 DIAGNOSIS — E785 Hyperlipidemia, unspecified: Secondary | ICD-10-CM

## 2018-07-23 DIAGNOSIS — E875 Hyperkalemia: Secondary | ICD-10-CM

## 2018-07-23 DIAGNOSIS — R112 Nausea with vomiting, unspecified: Secondary | ICD-10-CM

## 2018-07-23 DIAGNOSIS — N183 Chronic kidney disease, stage 3 (moderate): Secondary | ICD-10-CM

## 2018-07-23 DIAGNOSIS — J189 Pneumonia, unspecified organism: Secondary | ICD-10-CM

## 2018-07-24 DIAGNOSIS — E119 Type 2 diabetes mellitus without complications: Secondary | ICD-10-CM

## 2018-08-02 ENCOUNTER — Telehealth: Payer: Self-pay | Admitting: *Deleted

## 2018-08-02 NOTE — Telephone Encounter (Signed)
CALLED PATIENT'S DAUGHTER- DEBBIE HOOVER TO INFORM THAT FU APPT. HAS BEEN RESCHEDULED FOR 08-24-18 @ 9:30 AM, PATIENT'S DAUGHTER- DEBBIE HOOVER VERIFIED UNDERSTANDING THIS

## 2018-08-03 ENCOUNTER — Ambulatory Visit: Payer: Medicare HMO | Admitting: Radiation Oncology

## 2018-08-24 ENCOUNTER — Ambulatory Visit: Payer: Medicare HMO | Attending: Radiation Oncology | Admitting: Radiation Oncology

## 2019-01-31 DIAGNOSIS — J449 Chronic obstructive pulmonary disease, unspecified: Secondary | ICD-10-CM | POA: Diagnosis not present

## 2019-01-31 DIAGNOSIS — I160 Hypertensive urgency: Secondary | ICD-10-CM | POA: Diagnosis not present

## 2019-01-31 DIAGNOSIS — E119 Type 2 diabetes mellitus without complications: Secondary | ICD-10-CM | POA: Diagnosis not present

## 2019-01-31 DIAGNOSIS — E875 Hyperkalemia: Secondary | ICD-10-CM | POA: Diagnosis not present

## 2019-02-01 DIAGNOSIS — J449 Chronic obstructive pulmonary disease, unspecified: Secondary | ICD-10-CM | POA: Diagnosis not present

## 2019-02-01 DIAGNOSIS — I160 Hypertensive urgency: Secondary | ICD-10-CM | POA: Diagnosis not present

## 2019-02-01 DIAGNOSIS — E875 Hyperkalemia: Secondary | ICD-10-CM | POA: Diagnosis not present

## 2019-02-01 DIAGNOSIS — E119 Type 2 diabetes mellitus without complications: Secondary | ICD-10-CM | POA: Diagnosis not present

## 2019-02-02 DIAGNOSIS — I160 Hypertensive urgency: Secondary | ICD-10-CM | POA: Diagnosis not present

## 2019-02-02 DIAGNOSIS — E119 Type 2 diabetes mellitus without complications: Secondary | ICD-10-CM | POA: Diagnosis not present

## 2019-02-02 DIAGNOSIS — E875 Hyperkalemia: Secondary | ICD-10-CM | POA: Diagnosis not present

## 2019-02-02 DIAGNOSIS — J449 Chronic obstructive pulmonary disease, unspecified: Secondary | ICD-10-CM | POA: Diagnosis not present

## 2019-02-03 DIAGNOSIS — J449 Chronic obstructive pulmonary disease, unspecified: Secondary | ICD-10-CM | POA: Diagnosis not present

## 2019-02-03 DIAGNOSIS — E875 Hyperkalemia: Secondary | ICD-10-CM | POA: Diagnosis not present

## 2019-02-03 DIAGNOSIS — I160 Hypertensive urgency: Secondary | ICD-10-CM | POA: Diagnosis not present

## 2019-02-03 DIAGNOSIS — E119 Type 2 diabetes mellitus without complications: Secondary | ICD-10-CM | POA: Diagnosis not present

## 2019-02-04 DIAGNOSIS — J449 Chronic obstructive pulmonary disease, unspecified: Secondary | ICD-10-CM | POA: Diagnosis not present

## 2019-02-04 DIAGNOSIS — E119 Type 2 diabetes mellitus without complications: Secondary | ICD-10-CM | POA: Diagnosis not present

## 2019-02-04 DIAGNOSIS — I160 Hypertensive urgency: Secondary | ICD-10-CM | POA: Diagnosis not present

## 2019-02-04 DIAGNOSIS — E875 Hyperkalemia: Secondary | ICD-10-CM | POA: Diagnosis not present

## 2019-02-05 DIAGNOSIS — E875 Hyperkalemia: Secondary | ICD-10-CM | POA: Diagnosis not present

## 2019-02-05 DIAGNOSIS — J449 Chronic obstructive pulmonary disease, unspecified: Secondary | ICD-10-CM | POA: Diagnosis not present

## 2019-02-05 DIAGNOSIS — I160 Hypertensive urgency: Secondary | ICD-10-CM | POA: Diagnosis not present

## 2019-02-05 DIAGNOSIS — E119 Type 2 diabetes mellitus without complications: Secondary | ICD-10-CM | POA: Diagnosis not present

## 2019-02-06 DIAGNOSIS — E119 Type 2 diabetes mellitus without complications: Secondary | ICD-10-CM | POA: Diagnosis not present

## 2019-02-06 DIAGNOSIS — I160 Hypertensive urgency: Secondary | ICD-10-CM | POA: Diagnosis not present

## 2019-02-06 DIAGNOSIS — J449 Chronic obstructive pulmonary disease, unspecified: Secondary | ICD-10-CM | POA: Diagnosis not present

## 2019-02-06 DIAGNOSIS — E875 Hyperkalemia: Secondary | ICD-10-CM | POA: Diagnosis not present

## 2019-02-15 DIAGNOSIS — D72829 Elevated white blood cell count, unspecified: Secondary | ICD-10-CM

## 2019-02-15 DIAGNOSIS — E162 Hypoglycemia, unspecified: Secondary | ICD-10-CM

## 2019-02-15 DIAGNOSIS — J449 Chronic obstructive pulmonary disease, unspecified: Secondary | ICD-10-CM

## 2019-02-15 DIAGNOSIS — R569 Unspecified convulsions: Secondary | ICD-10-CM

## 2019-02-16 DIAGNOSIS — J449 Chronic obstructive pulmonary disease, unspecified: Secondary | ICD-10-CM | POA: Diagnosis not present

## 2019-02-16 DIAGNOSIS — D72829 Elevated white blood cell count, unspecified: Secondary | ICD-10-CM | POA: Diagnosis not present

## 2019-02-16 DIAGNOSIS — E162 Hypoglycemia, unspecified: Secondary | ICD-10-CM | POA: Diagnosis not present

## 2019-02-16 DIAGNOSIS — R569 Unspecified convulsions: Secondary | ICD-10-CM | POA: Diagnosis not present

## 2019-02-17 DIAGNOSIS — D72829 Elevated white blood cell count, unspecified: Secondary | ICD-10-CM | POA: Diagnosis not present

## 2019-02-17 DIAGNOSIS — E162 Hypoglycemia, unspecified: Secondary | ICD-10-CM | POA: Diagnosis not present

## 2019-02-17 DIAGNOSIS — J449 Chronic obstructive pulmonary disease, unspecified: Secondary | ICD-10-CM | POA: Diagnosis not present

## 2019-02-17 DIAGNOSIS — R569 Unspecified convulsions: Secondary | ICD-10-CM | POA: Diagnosis not present

## 2019-02-18 DIAGNOSIS — D72829 Elevated white blood cell count, unspecified: Secondary | ICD-10-CM | POA: Diagnosis not present

## 2019-02-18 DIAGNOSIS — J449 Chronic obstructive pulmonary disease, unspecified: Secondary | ICD-10-CM | POA: Diagnosis not present

## 2019-02-18 DIAGNOSIS — R569 Unspecified convulsions: Secondary | ICD-10-CM | POA: Diagnosis not present

## 2019-02-18 DIAGNOSIS — E162 Hypoglycemia, unspecified: Secondary | ICD-10-CM | POA: Diagnosis not present

## 2019-02-19 DIAGNOSIS — D72829 Elevated white blood cell count, unspecified: Secondary | ICD-10-CM | POA: Diagnosis not present

## 2019-02-19 DIAGNOSIS — R569 Unspecified convulsions: Secondary | ICD-10-CM | POA: Diagnosis not present

## 2019-02-19 DIAGNOSIS — E162 Hypoglycemia, unspecified: Secondary | ICD-10-CM | POA: Diagnosis not present

## 2019-02-19 DIAGNOSIS — J449 Chronic obstructive pulmonary disease, unspecified: Secondary | ICD-10-CM | POA: Diagnosis not present

## 2019-02-20 DIAGNOSIS — J449 Chronic obstructive pulmonary disease, unspecified: Secondary | ICD-10-CM | POA: Diagnosis not present

## 2019-02-20 DIAGNOSIS — E162 Hypoglycemia, unspecified: Secondary | ICD-10-CM | POA: Diagnosis not present

## 2019-02-20 DIAGNOSIS — R569 Unspecified convulsions: Secondary | ICD-10-CM | POA: Diagnosis not present

## 2019-02-20 DIAGNOSIS — D72829 Elevated white blood cell count, unspecified: Secondary | ICD-10-CM | POA: Diagnosis not present

## 2019-04-05 DEATH — deceased

## 2020-02-01 IMAGING — PT NM PET TUM IMG INITIAL (PI) SKULL BASE T - THIGH
7 series · 25 of 25 positions shown · non-contrast
Comparison: CT chest 12/20/2017

CLINICAL DATA: Initial treatment strategy for lung nodules.

EXAM:
NUCLEAR MEDICINE PET SKULL BASE TO THIGH
TECHNIQUE: 8.5 mCi F-18 FDG was injected intravenously. Full-ring PET imaging
was performed from the skull base to thigh after the radiotracer. CT
data was obtained and used for attenuation correction and anatomic
localization.
Fasting blood glucose: 177 mg/dl

[Series 3: pet sk_thigh ac · axial · 5.0mm · 4.07mm/px · z∈[-874,-2]mm · 5 of 219 slices shown]
[im 1/219]
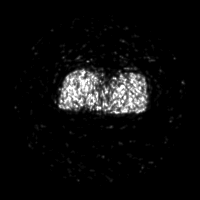
[im 55/219]
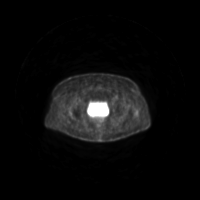
[im 110/219]
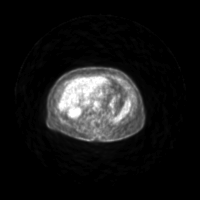
[im 164/219]
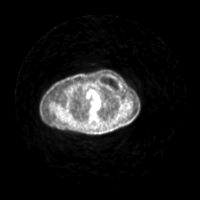
[im 219/219]
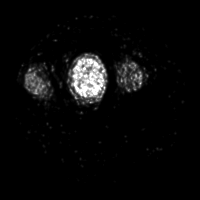

[Series 4: ct sk_thigh 5.0 b31f · axial · 5.0mm · 0.92mm/px · z∈[-874,-2]mm · 5 of 219 slices shown]
[im 1/219]
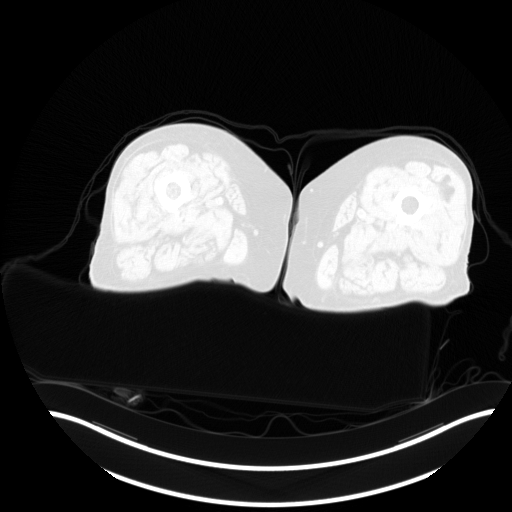
[im 55/219]
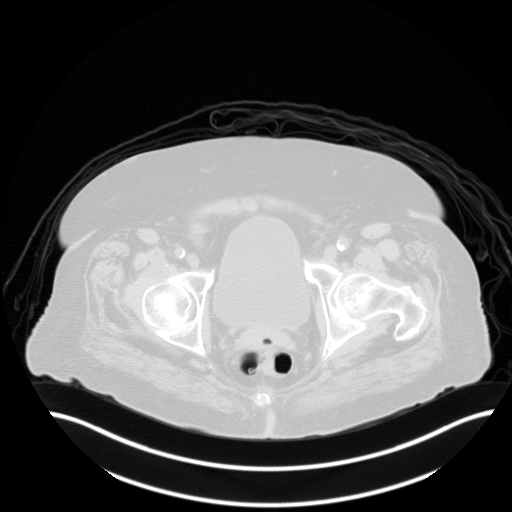
[im 110/219]
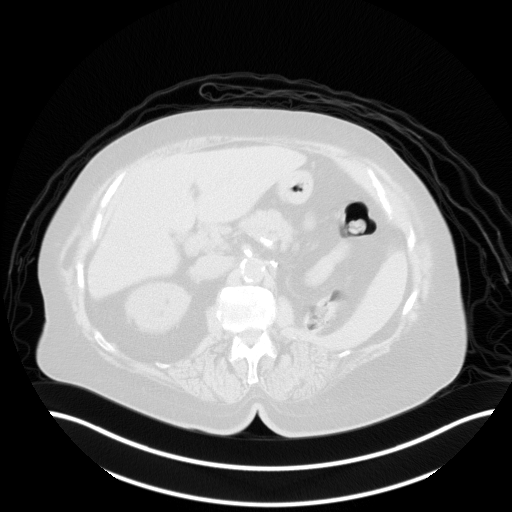
[im 164/219]
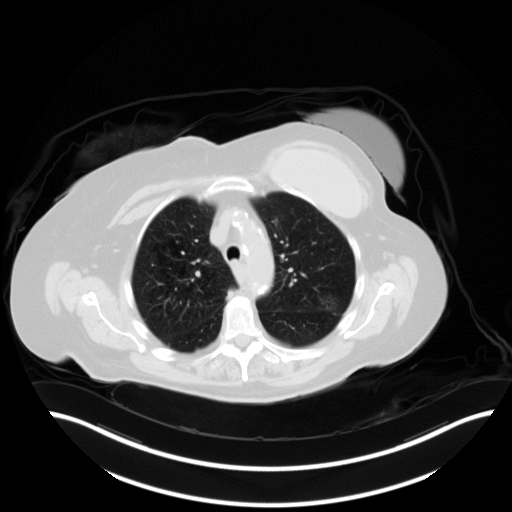
[im 219/219  brain]
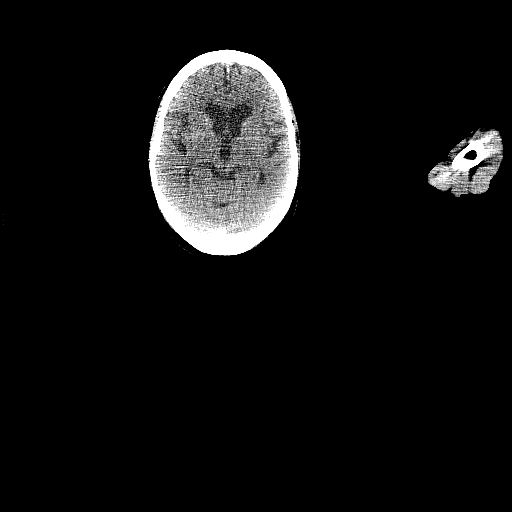

[Series 5: pet sk_thigh nac · axial · 5.0mm · 4.07mm/px · z∈[-874,-2]mm · 5 of 219 slices shown]
[im 1/219]
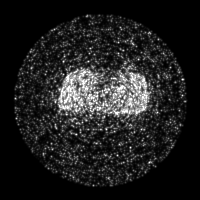
[im 55/219]
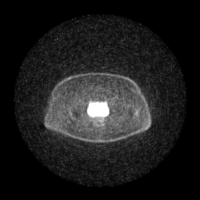
[im 110/219]
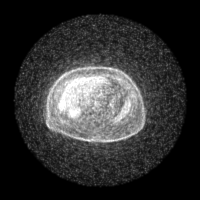
[im 164/219]
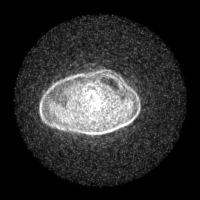
[im 219/219]
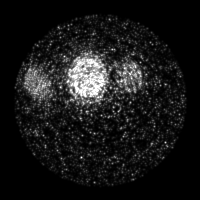

[Series 8: ct sk_thigh 5.0 b70f lung_bone · axial · 5.0mm · 0.65mm/px · z∈[-416,-144]mm · 2 of 69 slices shown]
[im 1/69  bone]
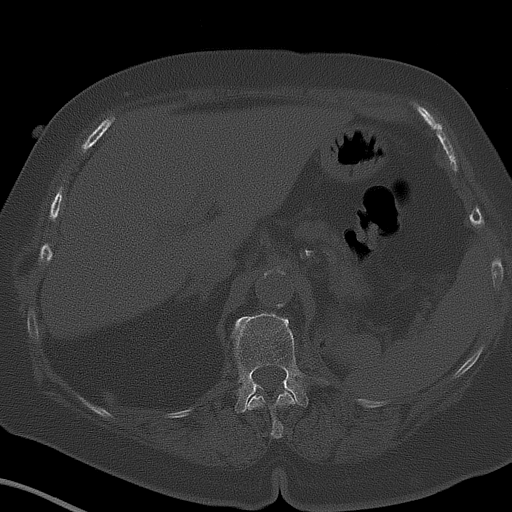
[im 69/69  bone]
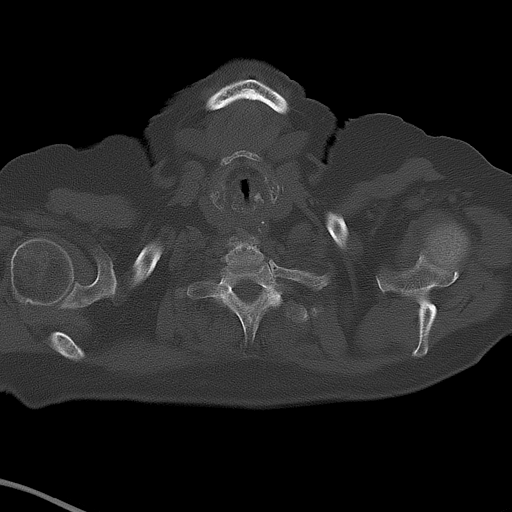

[Series 603: range-ct sk_thigh 5.0 (id)<alpha range> · 2 of 67 slices shown (1 of 2)]
[im 1/67]
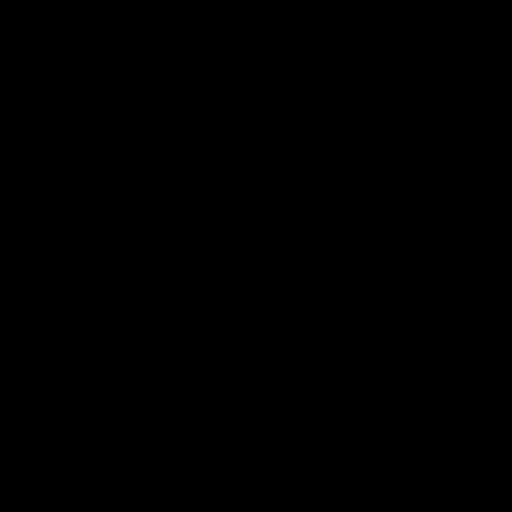
[im 67/67]
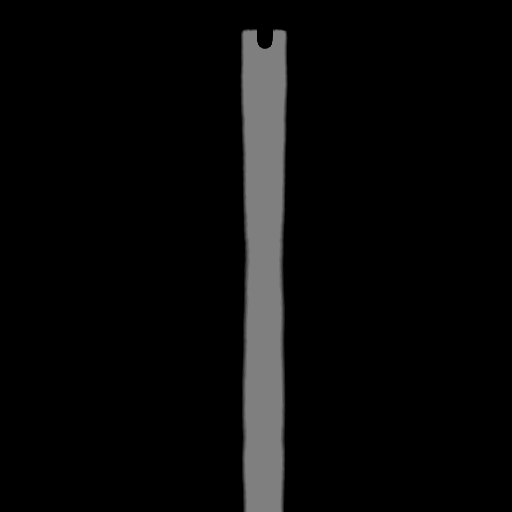

[Series 604: mip range · coronal · 1.81mm/px · 1 of 32 slices shown]
[im 1/32]
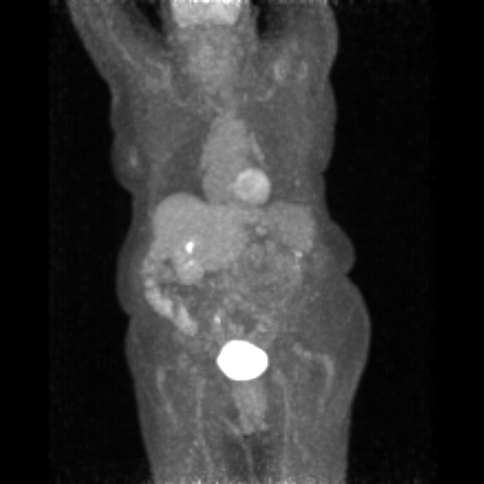

[Series 605: range-ct sk_thigh 5.0 (id)<alpha range> · 5 of 207 slices shown (2 of 2)]
[im 1/207]
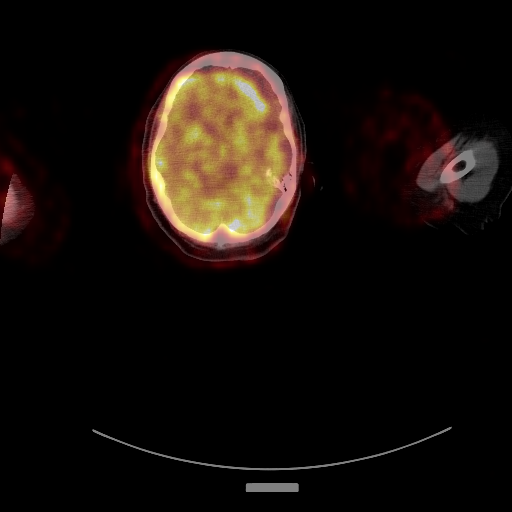
[im 52/207]
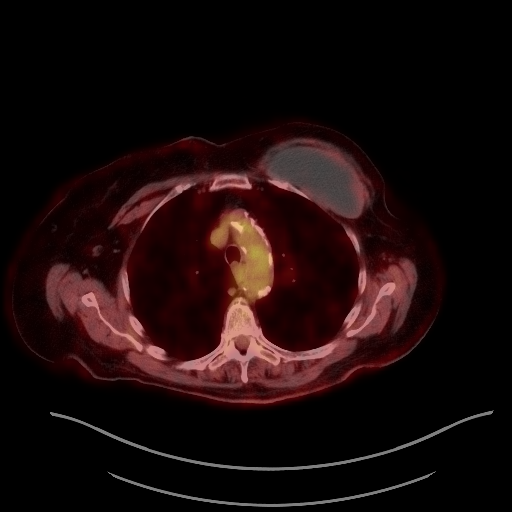
[im 104/207]
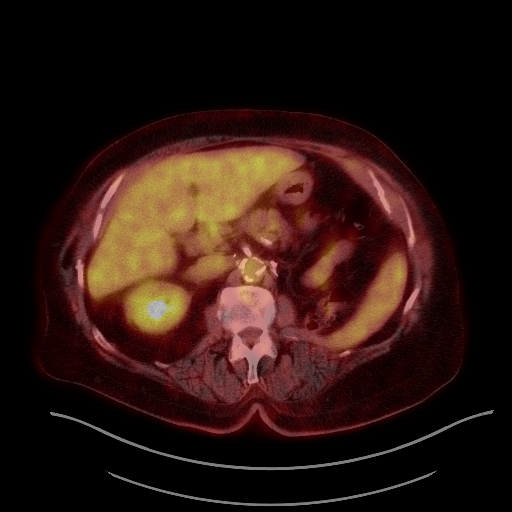
[im 155/207]
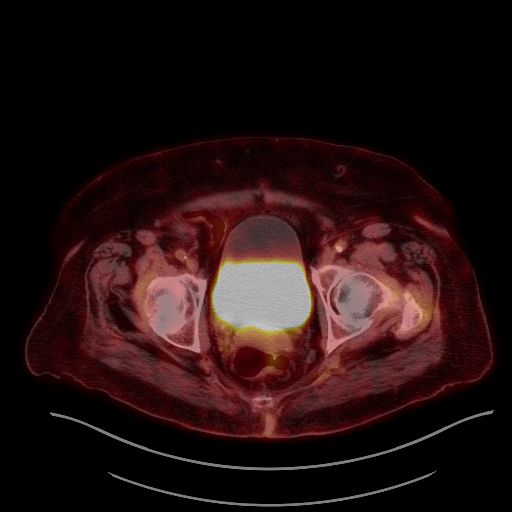
[im 207/207]
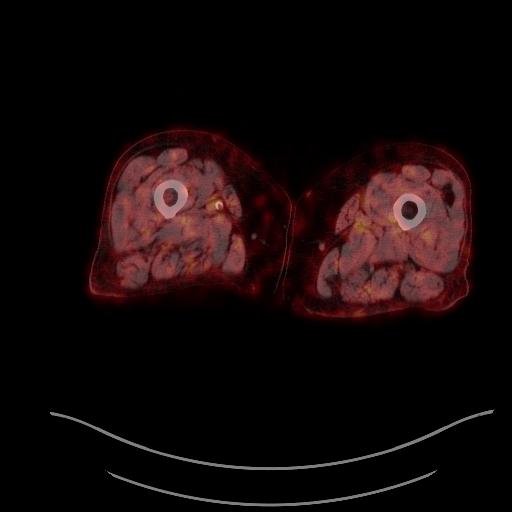

[25 of 25 positions shown; findings below may reference images not displayed]

FINDINGS: Mediastinal blood pool activity: SUV max

NECK: No hypermetabolic lymph nodes in the neck.

Incidental CT findings: none

CHEST: The left lower lobe pulmonary nodule measured previously at
1.4 x 2.3 cm is similar today at 1.5 x 2.1 cm (46/8).. This lesion
shows low level hypermetabolism with SUV max = 2.2. The
hypermetabolism appears to be localized to the more solid, nodular
component along the inferolateral margin of the lesion.

The 2.8 cm posterior left upper lobe sub solid nodule (image [DATE])
shows very low level FDG uptake with SUV max = 0.8.

Additional tiny bilateral pulmonary nodules show no discernible
hypermetabolism but this may be due to their tiny size.

Incidental CT findings: Emphysema noted bilaterally. No pleural
effusion. Coronary artery calcification is evident. Atherosclerotic
calcification is noted in the wall of the thoracic aorta.

ABDOMEN/PELVIS: No abnormal hypermetabolic activity within the
liver, pancreas, adrenal glands, or spleen. No hypermetabolic lymph
nodes in the abdomen or pelvis.

Incidental CT findings: Left kidney surgically absent. Tiny stones
identified lower pole right kidney. Diverticular changes noted in
the left colon without diverticulitis. Fibroid change noted in the
uterus.

SKELETON: No focal hypermetabolic activity to suggest skeletal
metastasis.

Incidental CT findings: none
IMPRESSION: 1. 2.1 cm left lower lobe pulmonary nodule demonstrates SUV max =
2.2 with activity mainly localized to the solid nodular component.
Imaging features are suspicious for neoplasm.
2. Sub solid 2.8 cm posterior left upper lobe nodule has very low
FDG accumulation. Given CT imaging features and detectable FDG
uptake, well differentiated/low-grade neoplasm remains a concern.
3.  Emphysema. (Q117X-38D.7)
4.  Aortic Atherosclerois (Q117X-170.0)
